# Patient Record
Sex: Male | Born: 1955 | Race: White | Hispanic: No | Marital: Married | Smoking: Former smoker
Health system: Southern US, Community
[De-identification: ages and names within clinical notes are randomized; demographics above are authoritative.]

## PROBLEM LIST (undated history)

## (undated) DIAGNOSIS — F32A Depression, unspecified: Secondary | ICD-10-CM

## (undated) DIAGNOSIS — L570 Actinic keratosis: Secondary | ICD-10-CM

## (undated) DIAGNOSIS — I10 Essential (primary) hypertension: Secondary | ICD-10-CM

## (undated) DIAGNOSIS — E782 Mixed hyperlipidemia: Secondary | ICD-10-CM

## (undated) DIAGNOSIS — F411 Generalized anxiety disorder: Secondary | ICD-10-CM

## (undated) DIAGNOSIS — L309 Dermatitis, unspecified: Secondary | ICD-10-CM

## (undated) HISTORY — DX: Dermatitis, unspecified: L30.9

## (undated) HISTORY — DX: Depression, unspecified: F32.A

## (undated) HISTORY — DX: Actinic keratosis: L57.0

## (undated) HISTORY — DX: Essential (primary) hypertension: I10

## (undated) HISTORY — DX: Generalized anxiety disorder: F41.1

## (undated) HISTORY — DX: Mixed hyperlipidemia: E78.2

---

## 2001-06-19 ENCOUNTER — Encounter: Payer: Self-pay | Admitting: Internal Medicine

## 2001-06-19 ENCOUNTER — Emergency Department (HOSPITAL_COMMUNITY): Admission: EM | Admit: 2001-06-19 | Discharge: 2001-06-19 | Payer: Self-pay | Admitting: Internal Medicine

## 2009-07-26 ENCOUNTER — Encounter: Payer: Self-pay | Admitting: Internal Medicine

## 2009-08-06 ENCOUNTER — Encounter: Payer: Self-pay | Admitting: Internal Medicine

## 2010-02-08 NOTE — Letter (Signed)
Summary: [facility] Patient Note   [facility] Patient Note   Imported By: Roderic Ovens 09/03/2009 12:11:50  _____________________________________________________________________  External Attachment:    Type:   Image     Comment:   External Document

## 2012-09-21 ENCOUNTER — Inpatient Hospital Stay (HOSPITAL_COMMUNITY)
Admission: EM | Admit: 2012-09-21 | Discharge: 2012-09-24 | DRG: 560 | Disposition: A | Discharge status: Home / Self Care | Payer: BC Managed Care – PPO | Attending: General Surgery | Admitting: General Surgery

## 2012-09-21 ENCOUNTER — Emergency Department (HOSPITAL_COMMUNITY): Payer: BC Managed Care – PPO

## 2012-09-21 ENCOUNTER — Encounter (HOSPITAL_COMMUNITY): Payer: Self-pay | Admitting: Emergency Medicine

## 2012-09-21 DIAGNOSIS — S27329A Contusion of lung, unspecified, initial encounter: Secondary | ICD-10-CM | POA: Diagnosis present

## 2012-09-21 DIAGNOSIS — S0003XA Contusion of scalp, initial encounter: Secondary | ICD-10-CM | POA: Diagnosis present

## 2012-09-21 DIAGNOSIS — R6510 Systemic inflammatory response syndrome (SIRS) of non-infectious origin without acute organ dysfunction: Secondary | ICD-10-CM

## 2012-09-21 DIAGNOSIS — S2242XA Multiple fractures of ribs, left side, initial encounter for closed fracture: Secondary | ICD-10-CM

## 2012-09-21 DIAGNOSIS — F1010 Alcohol abuse, uncomplicated: Secondary | ICD-10-CM | POA: Diagnosis present

## 2012-09-21 DIAGNOSIS — S42199A Fracture of other part of scapula, unspecified shoulder, initial encounter for closed fracture: Principal | ICD-10-CM | POA: Diagnosis present

## 2012-09-21 DIAGNOSIS — T148XXA Other injury of unspecified body region, initial encounter: Secondary | ICD-10-CM

## 2012-09-21 DIAGNOSIS — S27322A Contusion of lung, bilateral, initial encounter: Secondary | ICD-10-CM

## 2012-09-21 DIAGNOSIS — S42102A Fracture of unspecified part of scapula, left shoulder, initial encounter for closed fracture: Secondary | ICD-10-CM

## 2012-09-21 DIAGNOSIS — S270XXA Traumatic pneumothorax, initial encounter: Secondary | ICD-10-CM

## 2012-09-21 DIAGNOSIS — S42101A Fracture of unspecified part of scapula, right shoulder, initial encounter for closed fracture: Secondary | ICD-10-CM

## 2012-09-21 DIAGNOSIS — F10929 Alcohol use, unspecified with intoxication, unspecified: Secondary | ICD-10-CM | POA: Diagnosis present

## 2012-09-21 DIAGNOSIS — S2249XA Multiple fractures of ribs, unspecified side, initial encounter for closed fracture: Secondary | ICD-10-CM | POA: Diagnosis present

## 2012-09-21 DIAGNOSIS — S0510XA Contusion of eyeball and orbital tissues, unspecified eye, initial encounter: Secondary | ICD-10-CM | POA: Diagnosis present

## 2012-09-21 DIAGNOSIS — G4700 Insomnia, unspecified: Secondary | ICD-10-CM | POA: Diagnosis present

## 2012-09-21 DIAGNOSIS — T17900A Unspecified foreign body in respiratory tract, part unspecified causing asphyxiation, initial encounter: Secondary | ICD-10-CM

## 2012-09-21 DIAGNOSIS — T07XXXA Unspecified multiple injuries, initial encounter: Secondary | ICD-10-CM | POA: Diagnosis present

## 2012-09-21 DIAGNOSIS — J939 Pneumothorax, unspecified: Secondary | ICD-10-CM

## 2012-09-21 DIAGNOSIS — S2232XA Fracture of one rib, left side, initial encounter for closed fracture: Secondary | ICD-10-CM

## 2012-09-21 DIAGNOSIS — J690 Pneumonitis due to inhalation of food and vomit: Secondary | ICD-10-CM

## 2012-09-21 DIAGNOSIS — S42109A Fracture of unspecified part of scapula, unspecified shoulder, initial encounter for closed fracture: Secondary | ICD-10-CM

## 2012-09-21 DIAGNOSIS — D62 Acute posthemorrhagic anemia: Secondary | ICD-10-CM | POA: Diagnosis not present

## 2012-09-21 LAB — CBC WITH DIFFERENTIAL/PLATELET
HCT: 47.1 % (ref 39.0–52.0)
Hemoglobin: 16.3 g/dL (ref 13.0–17.0)
Lymphocytes Relative: 7 % — ABNORMAL LOW (ref 12–46)
MCHC: 34.6 g/dL (ref 30.0–36.0)
MCV: 90.9 fL (ref 78.0–100.0)
Monocytes Absolute: 0.5 10*3/uL (ref 0.1–1.0)
Monocytes Relative: 5 % (ref 3–12)
Neutro Abs: 8.7 10*3/uL — ABNORMAL HIGH (ref 1.7–7.7)

## 2012-09-21 LAB — COMPREHENSIVE METABOLIC PANEL
BUN: 14 mg/dL (ref 6–23)
CO2: 23 mEq/L (ref 19–32)
Chloride: 105 mEq/L (ref 96–112)
Creatinine, Ser: 1.08 mg/dL (ref 0.50–1.35)
GFR calc Af Amer: 87 mL/min — ABNORMAL LOW (ref >90)
GFR calc non Af Amer: 75 mL/min — ABNORMAL LOW (ref >90)
Total Bilirubin: 0.3 mg/dL (ref 0.3–1.2)

## 2012-09-21 LAB — URINALYSIS, ROUTINE W REFLEX MICROSCOPIC
Bilirubin Urine: NEGATIVE
Glucose, UA: NEGATIVE mg/dL
Ketones, ur: NEGATIVE mg/dL
Leukocytes, UA: NEGATIVE
pH: 5 (ref 5.0–8.0)

## 2012-09-21 LAB — POCT I-STAT, CHEM 8
Creatinine, Ser: 1.6 mg/dL — ABNORMAL HIGH (ref 0.50–1.35)
Hemoglobin: 17.3 g/dL — ABNORMAL HIGH (ref 13.0–17.0)
Sodium: 144 mEq/L (ref 135–145)
TCO2: 22 mmol/L (ref 0–100)

## 2012-09-21 LAB — RAPID URINE DRUG SCREEN, HOSP PERFORMED
Amphetamines: NOT DETECTED
Barbiturates: NOT DETECTED
Benzodiazepines: NOT DETECTED

## 2012-09-21 LAB — ETHANOL: Alcohol, Ethyl (B): 221 mg/dL — ABNORMAL HIGH (ref 0–11)

## 2012-09-21 LAB — LIPASE, BLOOD: Lipase: 167 U/L — ABNORMAL HIGH (ref 11–59)

## 2012-09-21 LAB — GLUCOSE, CAPILLARY: Glucose-Capillary: 140 mg/dL — ABNORMAL HIGH (ref 70–99)

## 2012-09-21 LAB — URINE MICROSCOPIC-ADD ON

## 2012-09-21 LAB — CBC
MCHC: 34.7 g/dL (ref 30.0–36.0)
RDW: 13.2 % (ref 11.5–15.5)

## 2012-09-21 LAB — CREATININE, SERUM
Creatinine, Ser: 0.9 mg/dL (ref 0.50–1.35)
GFR calc non Af Amer: >90 mL/min (ref >90)

## 2012-09-21 LAB — POCT I-STAT TROPONIN I

## 2012-09-21 LAB — MRSA PCR SCREENING: MRSA by PCR: NEGATIVE

## 2012-09-21 LAB — LACTIC ACID, PLASMA: Lactic Acid, Venous: 2.8 mmol/L — ABNORMAL HIGH (ref 0.5–2.2)

## 2012-09-21 MED ORDER — MORPHINE SULFATE 4 MG/ML IJ SOLN
4.0000 mg | Freq: Once | INTRAMUSCULAR | Status: AC
  Administered 2012-09-21: 4 mg via INTRAVENOUS

## 2012-09-21 MED ORDER — ENOXAPARIN SODIUM 40 MG/0.4ML ~~LOC~~ SOLN
40.0000 mg | SUBCUTANEOUS | Status: DC
  Administered 2012-09-21 – 2012-09-23 (×2): 40 mg via SUBCUTANEOUS
  Filled 2012-09-21 (×4): qty 0.4

## 2012-09-21 MED ORDER — MORPHINE SULFATE 2 MG/ML IJ SOLN: 1.0000 mg | INTRAMUSCULAR | Status: DC | PRN

## 2012-09-21 MED ORDER — IOHEXOL 300 MG/ML  SOLN
100.0000 mL | Freq: Once | INTRAMUSCULAR | Status: AC | PRN
  Administered 2012-09-21: 100 mL via INTRAVENOUS

## 2012-09-21 MED ORDER — POTASSIUM CHLORIDE IN NACL 20-0.9 MEQ/L-% IV SOLN
INTRAVENOUS | Status: DC
  Administered 2012-09-21 – 2012-09-23 (×4): via INTRAVENOUS
  Filled 2012-09-21 (×5): qty 1000

## 2012-09-21 MED ORDER — MORPHINE SULFATE 4 MG/ML IJ SOLN
4.0000 mg | INTRAMUSCULAR | Status: DC | PRN
  Administered 2012-09-21 – 2012-09-23 (×8): 4 mg via INTRAVENOUS
  Filled 2012-09-21 (×8): qty 1

## 2012-09-21 MED ORDER — ONDANSETRON HCL 4 MG PO TABS: 4.0000 mg | ORAL_TABLET | Freq: Four times a day (QID) | ORAL | Status: DC | PRN

## 2012-09-21 MED ORDER — SODIUM CHLORIDE 0.9 % IV BOLUS (SEPSIS)
1000.0000 mL | Freq: Once | INTRAVENOUS | Status: AC
  Administered 2012-09-21: 1000 mL via INTRAVENOUS

## 2012-09-21 MED ORDER — PIPERACILLIN-TAZOBACTAM 3.375 G IVPB
3.3750 g | Freq: Three times a day (TID) | INTRAVENOUS | Status: DC
  Administered 2012-09-21 – 2012-09-23 (×6): 3.375 g via INTRAVENOUS
  Filled 2012-09-21 (×7): qty 50

## 2012-09-21 MED ORDER — PANTOPRAZOLE SODIUM 40 MG PO TBEC
40.0000 mg | DELAYED_RELEASE_TABLET | Freq: Every day | ORAL | Status: DC
  Administered 2012-09-21: 40 mg via ORAL
  Filled 2012-09-21: qty 1

## 2012-09-21 MED ORDER — MORPHINE SULFATE 2 MG/ML IJ SOLN
2.0000 mg | INTRAMUSCULAR | Status: DC | PRN
  Administered 2012-09-21 – 2012-09-22 (×2): 2 mg via INTRAVENOUS
  Filled 2012-09-21 (×2): qty 1

## 2012-09-21 MED ORDER — ONDANSETRON HCL 4 MG/2ML IJ SOLN: 4.0000 mg | Freq: Four times a day (QID) | INTRAMUSCULAR | Status: DC | PRN

## 2012-09-21 MED ORDER — TETANUS-DIPHTH-ACELL PERTUSSIS 5-2.5-18.5 LF-MCG/0.5 IM SUSP
INTRAMUSCULAR | Status: AC
  Filled 2012-09-21: qty 0.5

## 2012-09-21 MED ORDER — ONDANSETRON HCL 4 MG/2ML IJ SOLN
4.0000 mg | Freq: Once | INTRAMUSCULAR | Status: AC
  Administered 2012-09-21: 4 mg via INTRAVENOUS

## 2012-09-21 MED ORDER — BACITRACIN ZINC 500 UNIT/GM EX OINT
TOPICAL_OINTMENT | Freq: Two times a day (BID) | CUTANEOUS | Status: DC
  Administered 2012-09-21 (×2): via TOPICAL
  Administered 2012-09-22: 15.5556 via TOPICAL
  Administered 2012-09-22: 10:00:00 via TOPICAL
  Administered 2012-09-23 (×2): 15.5556 via TOPICAL
  Filled 2012-09-21: qty 15
  Filled 2012-09-21 (×2): qty 28.35
  Filled 2012-09-21 (×2): qty 15

## 2012-09-21 MED ORDER — THIAMINE HCL 100 MG/ML IJ SOLN
100.0000 mg | Freq: Once | INTRAMUSCULAR | Status: AC
  Administered 2012-09-21: 100 mg via INTRAVENOUS
  Filled 2012-09-21: qty 2

## 2012-09-21 MED ORDER — PANTOPRAZOLE SODIUM 40 MG IV SOLR
40.0000 mg | Freq: Every day | INTRAVENOUS | Status: DC
  Administered 2012-09-22: 40 mg via INTRAVENOUS
  Filled 2012-09-21 (×2): qty 40

## 2012-09-21 MED ORDER — CLINDAMYCIN PHOSPHATE 600 MG/50ML IV SOLN
600.0000 mg | Freq: Once | INTRAVENOUS | Status: AC
  Administered 2012-09-21: 600 mg via INTRAVENOUS
  Filled 2012-09-21: qty 50

## 2012-09-21 MED ORDER — TETANUS-DIPHTH-ACELL PERTUSSIS 5-2.5-18.5 LF-MCG/0.5 IM SUSP
0.5000 mL | Freq: Once | INTRAMUSCULAR | Status: AC
  Administered 2012-09-21: 0.5 mL via INTRAMUSCULAR
  Filled 2012-09-21: qty 0.5
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
## 2012-09-21 NOTE — ED Notes (Signed)
Pt resting quietly at the time. Vital signs stable. Family at bedside. No signs of acute distress noted.

## 2012-09-21 NOTE — ED Notes (Signed)
DR. Lavella Lemons UPDATED SPOUSE ON RESULTS OF TEST , PLAN OF CARE /ADMISSION .

## 2012-09-21 NOTE — ED Notes (Signed)
Patient transported to CT WITH RN .
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
## 2012-09-21 NOTE — ED Notes (Signed)
Attempted to call report x1. Nurse to call back. 
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
## 2012-09-22 ENCOUNTER — Inpatient Hospital Stay (HOSPITAL_COMMUNITY): Payer: BC Managed Care – PPO

## 2012-09-22 LAB — BASIC METABOLIC PANEL
Calcium: 8.3 mg/dL — ABNORMAL LOW (ref 8.4–10.5)
GFR calc non Af Amer: >90 mL/min (ref >90)
Glucose, Bld: 154 mg/dL — ABNORMAL HIGH (ref 70–99)
Sodium: 139 mEq/L (ref 135–145)

## 2012-09-22 LAB — CBC
Hemoglobin: 11.9 g/dL — ABNORMAL LOW (ref 13.0–17.0)
MCH: 31.6 pg (ref 26.0–34.0)
MCHC: 34.5 g/dL (ref 30.0–36.0)
Platelets: 168 10*3/uL (ref 150–400)

## 2012-09-22 MED ORDER — OXYCODONE-ACETAMINOPHEN 5-325 MG PO TABS
1.0000 | ORAL_TABLET | ORAL | Status: DC | PRN
  Administered 2012-09-22 – 2012-09-23 (×2): 1 via ORAL
  Filled 2012-09-22 (×2): qty 1

## 2012-09-22 MED ORDER — KETOROLAC TROMETHAMINE 30 MG/ML IJ SOLN
30.0000 mg | Freq: Four times a day (QID) | INTRAMUSCULAR | Status: DC
  Administered 2012-09-22 – 2012-09-23 (×4): 30 mg via INTRAVENOUS
  Filled 2012-09-22 (×12): qty 1
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
## 2012-09-22 NOTE — Progress Notes (Signed)
Report given to [facility] RN at this time.  Pt has no c/o pain or s/s of any acute distress at this time.

## 2012-09-23 DIAGNOSIS — S27329A Contusion of lung, unspecified, initial encounter: Secondary | ICD-10-CM

## 2012-09-23 DIAGNOSIS — F10929 Alcohol use, unspecified with intoxication, unspecified: Secondary | ICD-10-CM | POA: Diagnosis present

## 2012-09-23 DIAGNOSIS — D62 Acute posthemorrhagic anemia: Secondary | ICD-10-CM

## 2012-09-23 DIAGNOSIS — S270XXA Traumatic pneumothorax, initial encounter: Secondary | ICD-10-CM

## 2012-09-23 DIAGNOSIS — T07XXXA Unspecified multiple injuries, initial encounter: Secondary | ICD-10-CM | POA: Diagnosis present

## 2012-09-23 DIAGNOSIS — G4700 Insomnia, unspecified: Secondary | ICD-10-CM | POA: Insufficient documentation

## 2012-09-23 DIAGNOSIS — S42102A Fracture of unspecified part of scapula, left shoulder, initial encounter for closed fracture: Secondary | ICD-10-CM

## 2012-09-23 DIAGNOSIS — S2242XA Multiple fractures of ribs, left side, initial encounter for closed fracture: Secondary | ICD-10-CM

## 2012-09-23 MED ORDER — OXYCODONE HCL 5 MG PO TABS
10.0000 mg | ORAL_TABLET | ORAL | Status: DC | PRN
  Administered 2012-09-23 – 2012-09-24 (×4): 10 mg via ORAL
  Filled 2012-09-23 (×4): qty 2

## 2012-09-23 MED ORDER — TRAZODONE HCL 50 MG PO TABS
50.0000 mg | ORAL_TABLET | Freq: Every day | ORAL | Status: DC
  Filled 2012-09-23 (×2): qty 1

## 2012-09-23 MED ORDER — ENOXAPARIN SODIUM 30 MG/0.3ML ~~LOC~~ SOLN
30.0000 mg | Freq: Two times a day (BID) | SUBCUTANEOUS | Status: DC
  Administered 2012-09-23: 30 mg via SUBCUTANEOUS
  Filled 2012-09-23 (×3): qty 0.3

## 2012-09-23 MED ORDER — NAPROXEN 500 MG PO TABS
500.0000 mg | ORAL_TABLET | Freq: Two times a day (BID) | ORAL | Status: DC
  Administered 2012-09-23 – 2012-09-24 (×3): 500 mg via ORAL
  Filled 2012-09-23 (×5): qty 1

## 2012-09-23 MED ORDER — MORPHINE SULFATE 2 MG/ML IJ SOLN: 2.0000 mg | INTRAMUSCULAR | Status: DC | PRN
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
## 2012-09-23 NOTE — Progress Notes (Signed)
Claims he is doing well with IS but I did not see one in his room. Will get a new one. Adjusting pain meds and advancing diet. Patient examined and I agree with the assessment and plan  Violeta Gelinas, MD, MPH, FACS Pager: [phone]  09/23/2012 9:53 AM
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
## 2012-09-23 NOTE — Progress Notes (Signed)
Orthopedic Tech Progress Note Patient Details:  William Goodwin 06-28-1955 [id number]  Ortho Devices Type of Ortho Device: Sling immobilizer Ortho Device/Splint Interventions: Application   Shawnie Pons 09/23/2012, 4:24 PM

## 2012-09-23 NOTE — Progress Notes (Signed)
UR completed 

## 2012-09-24 ENCOUNTER — Inpatient Hospital Stay (HOSPITAL_COMMUNITY): Payer: BC Managed Care – PPO

## 2012-09-24 ENCOUNTER — Encounter (INDEPENDENT_AMBULATORY_CARE_PROVIDER_SITE_OTHER): Payer: Self-pay | Admitting: Orthopedic Surgery

## 2012-09-24 MED ORDER — OXYCODONE-ACETAMINOPHEN 5-325 MG PO TABS: 1.0000 | ORAL_TABLET | ORAL | Status: DC | PRN

## 2012-09-24 MED ORDER — NAPROXEN 500 MG PO TABS: 500.0000 mg | ORAL_TABLET | Freq: Two times a day (BID) | ORAL | Status: DC
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
## 2012-09-24 NOTE — Progress Notes (Signed)
D/C home Violeta Gelinas, MD, MPH, FACS Pager: [phone]
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
## 2012-09-24 NOTE — Discharge Summary (Signed)
Breelle Hollywood, MD, MPH, FACS Pager: [phone]  

## 2012-09-27 LAB — CULTURE, BLOOD (ROUTINE X 2): Culture: NO GROWTH
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
## 2014-02-12 IMAGING — CR DG CHEST 1V PORT
1 series · 1 of 1 positions shown · non-contrast
Comparison: Prior chest CT 09/21/2012

CLINICAL DATA: Follow up pneumothorax

PORTABLE CHEST - 1 VIEW

[AP]
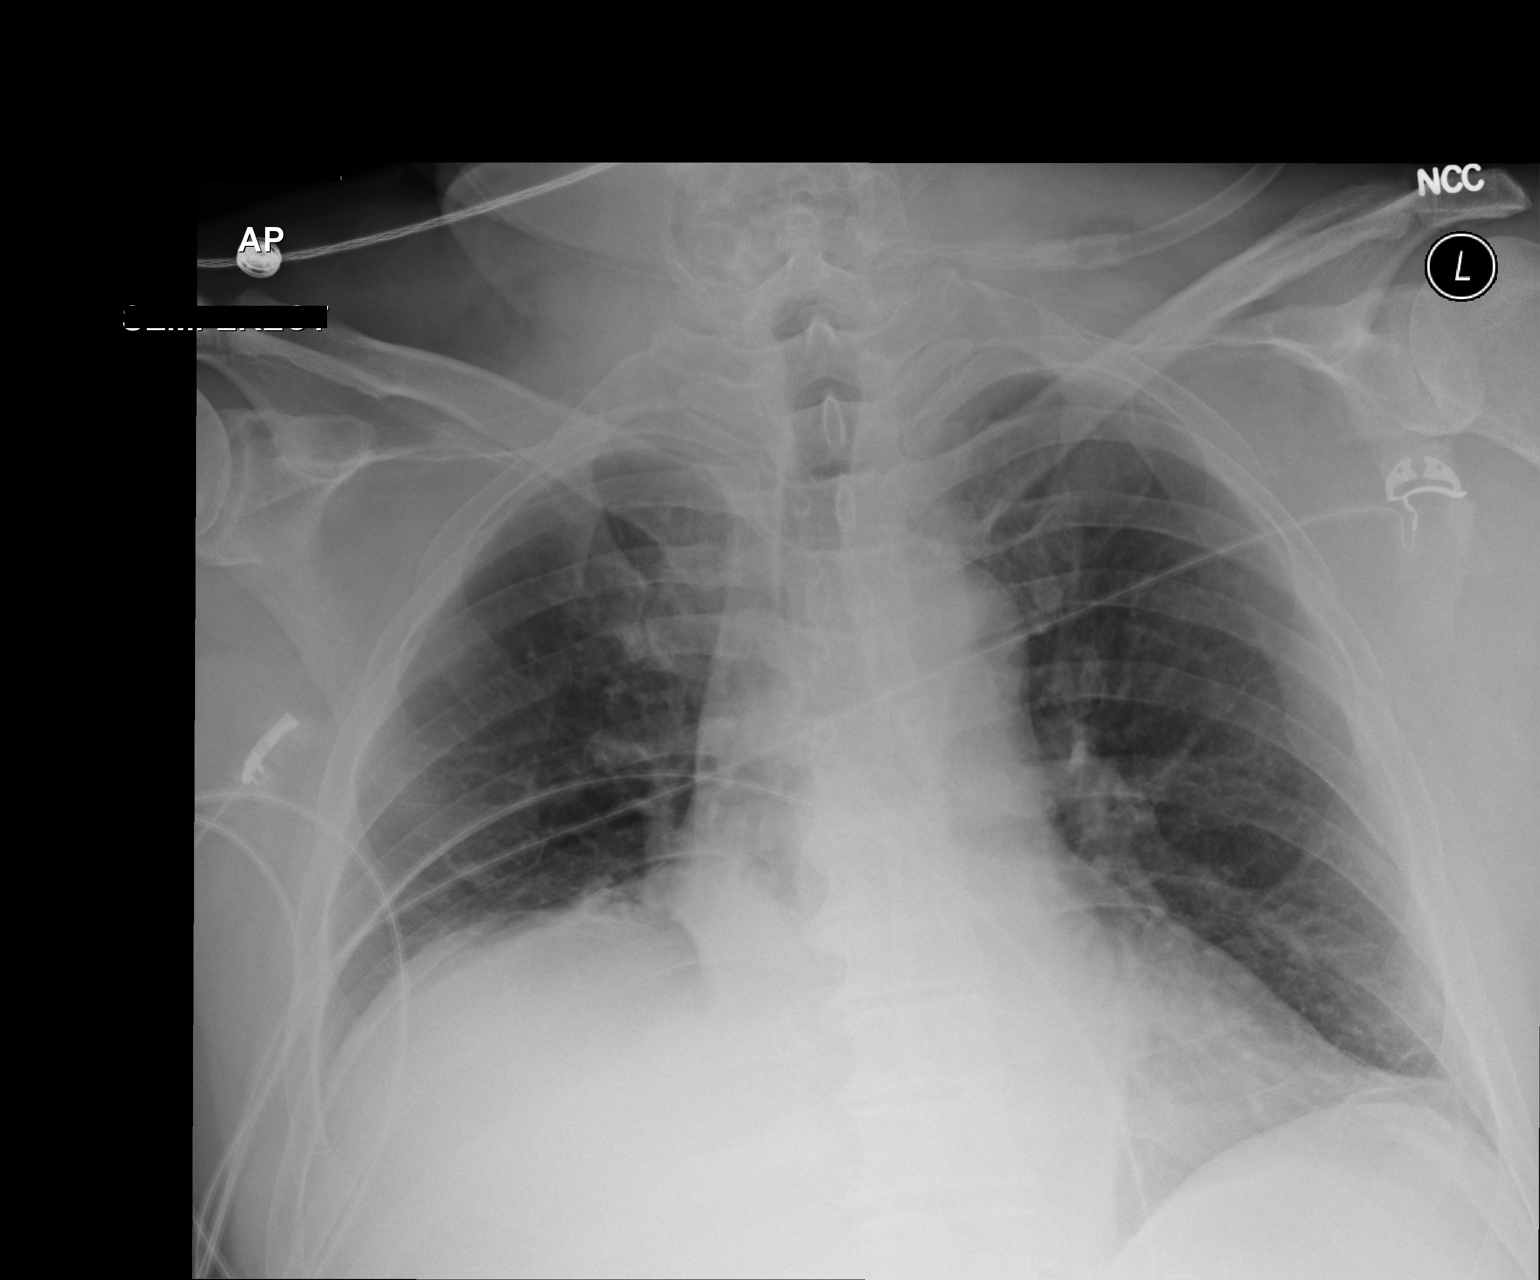

[1 of 1 positions shown; findings below may reference images not displayed]

FINDINGS: No definite pneumothorax identified.  Multiple left-sided
rib fractures are noted.  There is elevation of the right
hemidiaphragm.  Bibasilar subsegmental atelectasis.  Cardiac and
mediastinal contours remain within normal limits.  No pulmonary
edema, pleural effusion or new focal airspace consolidation.
IMPRESSION: 1.  No pneumothorax identified.
2.  Bibasilar subsegmental atelectasis greater on the right than
the left with some elevation of the right hemidiaphragm.
3.  Multiple left-sided rib fractures.

## 2017-01-17 ENCOUNTER — Encounter: Payer: Self-pay | Admitting: Family Medicine

## 2018-11-19 ENCOUNTER — Other Ambulatory Visit: Payer: Self-pay

## 2018-11-19 DIAGNOSIS — Z20822 Contact with and (suspected) exposure to covid-19: Secondary | ICD-10-CM

## 2018-11-20 LAB — NOVEL CORONAVIRUS, NAA: SARS-CoV-2, NAA: NOT DETECTED

## 2019-03-26 ENCOUNTER — Ambulatory Visit: Payer: Self-pay

## 2019-03-28 ENCOUNTER — Ambulatory Visit: Payer: Medicaid Other | Attending: Internal Medicine

## 2019-03-28 ENCOUNTER — Other Ambulatory Visit: Payer: Self-pay

## 2019-03-28 DIAGNOSIS — Z23 Encounter for immunization: Secondary | ICD-10-CM

## 2019-03-28 NOTE — Progress Notes (Signed)
   Covid-19 Vaccination Clinic  Name:  William Goodwin    MRN: [id number] DOB: [birth year]  03/28/2019  [name] was observed post Covid-19 immunization for 15 minutes without incident. He was provided with Vaccine Information Sheet and instruction to access the V-Safe system.   [name] was instructed to call 911 with any severe reactions post vaccine: Marland Kitchen Difficulty breathing  . Swelling of face and throat  . A fast heartbeat  . A bad rash all over body  . Dizziness and weakness   Immunizations Administered    Name Date Dose VIS Date Route   Pfizer COVID-19 Vaccine 03/28/2019  3:14 PM 0.3 mL 12/20/2018 Intramuscular   Manufacturer: [place]   Lot: [id number]   [place]: [id number]

## 2019-04-22 ENCOUNTER — Ambulatory Visit: Payer: Medicaid Other | Attending: Internal Medicine

## 2019-04-22 DIAGNOSIS — Z23 Encounter for immunization: Secondary | ICD-10-CM

## 2019-04-22 DIAGNOSIS — I10 Essential (primary) hypertension: Secondary | ICD-10-CM | POA: Diagnosis not present

## 2019-04-22 DIAGNOSIS — E782 Mixed hyperlipidemia: Secondary | ICD-10-CM | POA: Diagnosis not present

## 2019-04-22 DIAGNOSIS — G4700 Insomnia, unspecified: Secondary | ICD-10-CM | POA: Diagnosis not present

## 2019-04-22 DIAGNOSIS — R635 Abnormal weight gain: Secondary | ICD-10-CM | POA: Diagnosis not present

## 2019-04-22 NOTE — Progress Notes (Signed)
   Covid-19 Vaccination Clinic  Name:  William Goodwin    MRN: [id number] DOB: 1955/04/26  04/22/2019  [name] was observed post Covid-19 immunization for 15 minutes without incident. He was provided with Vaccine Information Sheet and instruction to access the V-Safe system.   [name] was instructed to call 911 with any severe reactions post vaccine: Marland Kitchen Difficulty breathing  . Swelling of face and throat  . A fast heartbeat  . A bad rash all over body  . Dizziness and weakness   Immunizations Administered    Name Date Dose VIS Date Route   Pfizer COVID-19 Vaccine 04/22/2019  4:19 PM 0.3 mL 12/20/2018 Intramuscular   Manufacturer: [place]   Lot: [id number]   [place]: [id number]

## 2019-07-17 DIAGNOSIS — Z0000 Encounter for general adult medical examination without abnormal findings: Secondary | ICD-10-CM | POA: Diagnosis not present

## 2019-07-17 DIAGNOSIS — Z125 Encounter for screening for malignant neoplasm of prostate: Secondary | ICD-10-CM | POA: Diagnosis not present

## 2019-07-17 DIAGNOSIS — Z0184 Encounter for antibody response examination: Secondary | ICD-10-CM | POA: Diagnosis not present

## 2019-07-22 DIAGNOSIS — Z23 Encounter for immunization: Secondary | ICD-10-CM | POA: Diagnosis not present

## 2019-07-22 DIAGNOSIS — Z0000 Encounter for general adult medical examination without abnormal findings: Secondary | ICD-10-CM | POA: Diagnosis not present

## 2019-07-22 DIAGNOSIS — Z1211 Encounter for screening for malignant neoplasm of colon: Secondary | ICD-10-CM | POA: Diagnosis not present

## 2019-07-22 DIAGNOSIS — I10 Essential (primary) hypertension: Secondary | ICD-10-CM | POA: Diagnosis not present

## 2019-07-30 ENCOUNTER — Other Ambulatory Visit: Payer: Self-pay | Admitting: Family Medicine

## 2019-07-30 DIAGNOSIS — Z122 Encounter for screening for malignant neoplasm of respiratory organs: Secondary | ICD-10-CM

## 2019-07-31 ENCOUNTER — Encounter: Payer: Self-pay | Admitting: Gastroenterology

## 2019-08-11 ENCOUNTER — Ambulatory Visit: Payer: Medicaid Other

## 2019-08-25 DIAGNOSIS — I10 Essential (primary) hypertension: Secondary | ICD-10-CM | POA: Diagnosis not present

## 2019-08-25 DIAGNOSIS — Z87891 Personal history of nicotine dependence: Secondary | ICD-10-CM | POA: Diagnosis not present

## 2019-08-25 DIAGNOSIS — R195 Other fecal abnormalities: Secondary | ICD-10-CM | POA: Diagnosis not present

## 2019-09-29 ENCOUNTER — Other Ambulatory Visit: Payer: Self-pay

## 2019-09-29 NOTE — Progress Notes (Signed)
Reconciled meds from outside source

## 2019-09-30 ENCOUNTER — Ambulatory Visit: Payer: Medicaid Other | Admitting: Gastroenterology

## 2019-10-07 ENCOUNTER — Encounter: Payer: Self-pay | Admitting: Gastroenterology

## 2019-10-07 ENCOUNTER — Ambulatory Visit: Payer: BC Managed Care – PPO | Admitting: Gastroenterology

## 2019-10-07 VITALS — BP 120/70 | HR 79 | Ht 69.0 in | Wt 211.0 lb

## 2019-10-07 DIAGNOSIS — Z1211 Encounter for screening for malignant neoplasm of colon: Secondary | ICD-10-CM

## 2019-10-07 DIAGNOSIS — R195 Other fecal abnormalities: Secondary | ICD-10-CM | POA: Diagnosis not present

## 2019-10-07 DIAGNOSIS — K6289 Other specified diseases of anus and rectum: Secondary | ICD-10-CM | POA: Diagnosis not present

## 2019-10-07 MED ORDER — SUTAB 1479-225-188 MG PO TABS: 1.0000 | ORAL_TABLET | Freq: Once | ORAL | 0 refills | Status: AC

## 2019-10-07 NOTE — Patient Instructions (Addendum)
If you are age 63 or older, your body mass index should be between 23-30. Your Body mass index is 31.16 kg/m. If this is out of the aforementioned range listed, please consider follow up with your Primary Care Provider.  If you are age 25 or younger, your body mass index should be between 19-25. Your Body mass index is 31.16 kg/m. If this is out of the aformentioned range listed, please consider follow up with your Primary Care Provider.   You have been scheduled for a colonoscopy. Please follow written instructions given to you at your visit today.  Please pick up your prep supplies at the pharmacy within the next 1-3 days. If you use inhalers (even only as needed), please bring them with you on the day of your procedure.  Thank you for entrusting me with your care and for choosing [facility], Dr. Erath Cellar

## 2019-10-07 NOTE — Progress Notes (Signed)
HPI :  64 year old male with a history of HTN, HLD, insomnia, referred by Rachell Cipro MD for heme positive stools.   The patient has never had a prior colonoscopy.  He denies any family history of colon cancer.  He has normal bowel function.  States he really does not have any blood noted in the stools.  He does have some occasional blood noted on the toilet paper when wiping himself in the setting of having a painful bowel movement.  This is not too common and is a rare occurrence.  He denies any abdominal pains.  He eats well, no reflux or dysphagia.  No weight loss.  He denies any cardiopulmonary symptoms.  He has no prior surgical history.  He smoked for about 20 years, but quit about 24 years ago. He denies any history of anemia.    Ifob 07/22/19 - positive  Labs from PCP: 07/17/19 - Hgb 15.0, MCV 94, WBC 5.9, plt 212 BUN 15, Cr 1.08 AP 77, AST 21, ALT 34   Past Medical History:  Diagnosis Date  . Benign essential hypertension   . Depression   . Eczema   . Generalized anxiety disorder   . Mixed hyperlipidemia   . Senile hyperkeratosis      History reviewed. No pertinent surgical history. PSH - none History reviewed. No pertinent family history. Denies any medical history in his family Social History   Tobacco Use  . Smoking status: Former Research scientist (life sciences)  . Smokeless tobacco: Never Used  Substance Use Topics  . Alcohol use: Yes    Comment: 2-4 drinks per week  . Drug use: No   Current Outpatient Medications  Medication Sig Dispense Refill  . losartan (COZAAR) 50 MG tablet Take 50 mg by mouth daily.    . rosuvastatin (CRESTOR) 5 MG tablet Take 5 mg by mouth daily.    . traZODone (DESYREL) 50 MG tablet Take 50 mg by mouth at bedtime.    Marland Kitchen zolpidem (AMBIEN) 10 MG tablet Take 10 mg by mouth at bedtime as needed.     No current facility-administered medications for this visit.   No Known Allergies   Review of Systems: All systems reviewed and negative except where noted  in HPI.    Labs per HPI  Physical Exam: BP 120/70   Pulse 79   Ht 5\' 9"  (1.753 m)   Wt 211 lb (95.7 kg)   BMI 31.16 kg/m  Constitutional: Pleasant,well-developed, male in no acute distress. HEENT: Normocephalic and atraumatic. Conjunctivae are normal. No scleral icterus. Neck supple.  Cardiovascular: Normal rate, regular rhythm.  Pulmonary/chest: Effort normal and breath sounds normal. No wheezing, rales or rhonchi. Abdominal: Soft, nondistended, nontender.  There are no masses palpable.  Extremities: no edema Lymphadenopathy: No cervical adenopathy noted. Neurological: Alert and oriented to person place and time. Skin: Skin is warm and dry. No rashes noted. Psychiatric: Normal mood and affect. Behavior is normal.   ASSESSMENT AND PLAN: 64 year old male here for new patient assessment of the following:  Heme positive stool / colon cancer screening / rectal discomfort - history as above, patient very healthy without anemia presenting with heme positive stool, positive FIT.  No overt blood in the stool but does have rare blood in the toilet paper in the setting of passing a bowel movement that causes rectal discomfort.  He is never had a colonoscopy, he warrants a colonoscopy to evaluate the stool test.  This will also evaluate his rectal symptoms as well.  I  discussed colonoscopy with him, risk benefits of the exam and that of anesthesia and he wants to proceed.  Further recommendations pending results.  He agreed  Fowler Cellar, MD [facility]  CC: Fanny Bien, MD

## 2019-10-23 DIAGNOSIS — E782 Mixed hyperlipidemia: Secondary | ICD-10-CM | POA: Diagnosis not present

## 2019-10-23 DIAGNOSIS — E669 Obesity, unspecified: Secondary | ICD-10-CM | POA: Diagnosis not present

## 2019-10-23 DIAGNOSIS — G4700 Insomnia, unspecified: Secondary | ICD-10-CM | POA: Diagnosis not present

## 2019-10-23 DIAGNOSIS — I10 Essential (primary) hypertension: Secondary | ICD-10-CM | POA: Diagnosis not present

## 2019-11-04 DIAGNOSIS — I10 Essential (primary) hypertension: Secondary | ICD-10-CM | POA: Diagnosis not present

## 2019-11-04 DIAGNOSIS — E669 Obesity, unspecified: Secondary | ICD-10-CM | POA: Diagnosis not present

## 2019-11-04 DIAGNOSIS — Z23 Encounter for immunization: Secondary | ICD-10-CM | POA: Diagnosis not present

## 2019-11-04 DIAGNOSIS — E782 Mixed hyperlipidemia: Secondary | ICD-10-CM | POA: Diagnosis not present

## 2019-11-14 ENCOUNTER — Ambulatory Visit (AMBULATORY_SURGERY_CENTER): Payer: BC Managed Care – PPO | Admitting: Gastroenterology

## 2019-11-14 ENCOUNTER — Encounter: Payer: Self-pay | Admitting: Gastroenterology

## 2019-11-14 ENCOUNTER — Other Ambulatory Visit: Payer: Self-pay

## 2019-11-14 VITALS — BP 139/90 | HR 67 | Temp 98.3°F | Resp 21 | Ht 69.0 in | Wt 211.0 lb

## 2019-11-14 DIAGNOSIS — D120 Benign neoplasm of cecum: Secondary | ICD-10-CM | POA: Diagnosis not present

## 2019-11-14 DIAGNOSIS — D129 Benign neoplasm of anus and anal canal: Secondary | ICD-10-CM

## 2019-11-14 DIAGNOSIS — R195 Other fecal abnormalities: Secondary | ICD-10-CM

## 2019-11-14 DIAGNOSIS — D128 Benign neoplasm of rectum: Secondary | ICD-10-CM

## 2019-11-14 DIAGNOSIS — K621 Rectal polyp: Secondary | ICD-10-CM | POA: Diagnosis not present

## 2019-11-14 DIAGNOSIS — K5730 Diverticulosis of large intestine without perforation or abscess without bleeding: Secondary | ICD-10-CM

## 2019-11-14 MED ORDER — SODIUM CHLORIDE 0.9 % IV SOLN: 500.0000 mL | INTRAVENOUS | Status: DC

## 2019-11-14 NOTE — Patient Instructions (Signed)
Handouts provided on polyps, diverticulosis and hemorrhoids.   YOU HAD AN ENDOSCOPIC PROCEDURE TODAY AT THE [facility]:   Refer to the procedure report that was given to you for any specific questions about what was found during the examination.  If the procedure report does not answer your questions, please call your gastroenterologist to clarify.  If you requested that your care partner not be given the details of your procedure findings, then the procedure report has been included in a sealed envelope for you to review at your convenience later.  YOU SHOULD EXPECT: Some feelings of bloating in the abdomen. Passage of more gas than usual.  Walking can help get rid of the air that was put into your GI tract during the procedure and reduce the bloating. If you had a lower endoscopy (such as a colonoscopy or flexible sigmoidoscopy) you may notice spotting of blood in your stool or on the toilet paper. If you underwent a bowel prep for your procedure, you may not have a normal bowel movement for a few days.  Please Note:  You might notice some irritation and congestion in your nose or some drainage.  This is from the oxygen used during your procedure.  There is no need for concern and it should clear up in a day or so.  SYMPTOMS TO REPORT IMMEDIATELY:   Following lower endoscopy (colonoscopy or flexible sigmoidoscopy):  Excessive amounts of blood in the stool  Significant tenderness or worsening of abdominal pains  Swelling of the abdomen that is new, acute  Fever of 100F or higher   For urgent or emergent issues, a gastroenterologist can be reached at any hour by calling [phone]. Do not use MyChart messaging for urgent concerns.    DIET:  We do recommend a small meal at first, but then you may proceed to your regular diet.  Drink plenty of fluids but you should avoid alcoholic beverages for 24 hours.  ACTIVITY:  You should plan to take it easy for the rest of today and  you should NOT DRIVE or use heavy machinery until tomorrow (because of the sedation medicines used during the test).    FOLLOW UP: Our staff will call the number listed on your records 48-72 hours following your procedure to check on you and address any questions or concerns that you may have regarding the information given to you following your procedure. If we do not reach you, we will leave a message.  We will attempt to reach you two times.  During this call, we will ask if you have developed any symptoms of COVID 19. If you develop any symptoms (ie: fever, flu-like symptoms, shortness of breath, cough etc.) before then, please call [phone].  If you test positive for Covid 19 in the 2 weeks post procedure, please call and report this information to us.    If any biopsies were taken you will be contacted by phone or by letter within the next 1-3 weeks.  Please call us at [phone] if you have not heard about the biopsies in 3 weeks.    SIGNATURES/CONFIDENTIALITY: You and/or your care partner have signed paperwork which will be entered into your electronic medical record.  These signatures attest to the fact that that the information above on your After Visit Summary has been reviewed and is understood.  Full responsibility of the confidentiality of this discharge information lies with you and/or your care-partner.  

## 2019-11-14 NOTE — Op Note (Signed)
[place] Patient Name: William Goodwin Procedure Date: 11/14/2019 1:10 PM MRN: [id number] Endoscopist: Remo Lipps P. Havery Moros , MD Age: 64 Referring MD:  Date of Birth: [birth year] Gender: Male Account #: [id number] Procedure:                Colonoscopy Indications:              Positive fecal immunochemical test Medicines:                Monitored Anesthesia Care Procedure:                Pre-Anesthesia Assessment:                           - Prior to the procedure, a History and Physical                            was performed, and patient medications and                            allergies were reviewed. The patient's tolerance of                            previous anesthesia was also reviewed. The risks                            and benefits of the procedure and the sedation                            options and risks were discussed with the patient.                            All questions were answered, and informed consent                            was obtained. Prior Anticoagulants: The patient has                            taken no previous anticoagulant or antiplatelet                            agents. ASA Grade Assessment: II - A patient with                            mild systemic disease. After reviewing the risks                            and benefits, the patient was deemed in                            satisfactory condition to undergo the procedure.                           After obtaining informed consent, the colonoscope  was passed under direct vision. Throughout the                            procedure, the patient's blood pressure, pulse, and                            oxygen saturations were monitored continuously. The                            Colonoscope was introduced through the anus and                            advanced to the the terminal ileum, with                            identification of the  appendiceal orifice and IC                            valve. The colonoscopy was performed without                            difficulty. The patient tolerated the procedure                            well. The quality of the bowel preparation was                            adequate. The terminal ileum, ileocecal valve,                            appendiceal orifice, and rectum were photographed. Scope In: 1:30:43 PM Scope Out: 1:51:00 PM Scope Withdrawal Time: 0 hours 17 minutes 26 seconds  Total Procedure Duration: 0 hours 20 minutes 17 seconds  Findings:                 The perianal and digital rectal examinations were                            normal.                           The terminal ileum appeared normal.                           A 4 mm polyp was found in the appendiceal orifice.                            The polyp was sessile. The polyp was removed with a                            cold snare. Resection and retrieval were complete.                           A diminutive polyp was found in the rectum. The  polyp was sessile. The polyp was removed with a                            cold snare. Resection and retrieval were complete.                           Scattered medium-mouthed diverticula were found in                            the entire colon.                           Internal hemorrhoids were found during                            retroflexion. The hemorrhoids were small.                           The exam was otherwise without abnormality. Complications:            No immediate complications. Estimated blood loss:                            Minimal. Estimated Blood Loss:     Estimated blood loss was minimal. Impression:               - The examined portion of the ileum was normal.                           - One 4 mm polyp at the appendiceal orifice,                            removed with a cold snare. Resected and retrieved.                            - One diminutive polyp in the rectum, removed with                            a cold snare. Resected and retrieved.                           - Diverticulosis in the entire examined colon.                           - Internal hemorrhoids.                           - The examination was otherwise normal. Recommendation:           - Patient has a contact number available for                            emergencies. The signs and symptoms of potential                            delayed complications were discussed with  the                            patient. Return to normal activities tomorrow.                            Written discharge instructions were provided to the                            patient.                           - Resume previous diet.                           - Continue present medications.                           - Await pathology results. Remo Lipps P. Osmar Howton, MD 11/14/2019 1:55:44 PM This report has been signed electronically.

## 2019-11-14 NOTE — Progress Notes (Signed)
Called to room to assist during endoscopic procedure.  Patient ID and intended procedure confirmed with present staff. Received instructions for my participation in the procedure from the performing physician.  

## 2019-11-14 NOTE — Progress Notes (Signed)
pt tolerated well. VSS. awake and to recovery. Report given to RN.  

## 2019-11-18 ENCOUNTER — Telehealth: Payer: Self-pay

## 2019-11-18 NOTE — Telephone Encounter (Signed)
  Follow up Call-  Call back number 11/14/2019  Post procedure Call Back phone  # [phone]  Permission to leave phone message Yes  Some recent data might be hidden     1st follow up call made.  NALM

## 2019-11-18 NOTE — Telephone Encounter (Signed)
  Follow up Call-  Call back number 11/14/2019  Post procedure Call Back phone  # [phone]  Permission to leave phone message Yes  Some recent data might be hidden     Patient questions:  Do you have a fever, pain , or abdominal swelling? No. Pain Score  0 *  Have you tolerated food without any problems? Yes.    Have you been able to return to your normal activities? Yes.    Do you have any questions about your discharge instructions: Diet   No. Medications  No. Follow up visit  No.  Do you have questions or concerns about your Care? No.  Actions: * If pain score is 4 or above: No action needed, pain <4.  1. Have you developed a fever since your procedure? no  2.   Have you had an respiratory symptoms (SOB or cough) since your procedure? no  3.   Have you tested positive for COVID 19 since your procedure no  4.   Have you had any family members/close contacts diagnosed with the COVID 19 since your procedure?  no   If yes to any of these questions please route to Joylene John, RN and Joella Prince, RN

## 2019-12-08 DIAGNOSIS — E782 Mixed hyperlipidemia: Secondary | ICD-10-CM | POA: Diagnosis not present

## 2019-12-08 DIAGNOSIS — I10 Essential (primary) hypertension: Secondary | ICD-10-CM | POA: Diagnosis not present

## 2019-12-08 DIAGNOSIS — Z23 Encounter for immunization: Secondary | ICD-10-CM | POA: Diagnosis not present

## 2019-12-08 DIAGNOSIS — E669 Obesity, unspecified: Secondary | ICD-10-CM | POA: Diagnosis not present

## 2020-01-29 DIAGNOSIS — I10 Essential (primary) hypertension: Secondary | ICD-10-CM | POA: Diagnosis not present

## 2020-01-29 DIAGNOSIS — E669 Obesity, unspecified: Secondary | ICD-10-CM | POA: Diagnosis not present

## 2020-01-29 DIAGNOSIS — E782 Mixed hyperlipidemia: Secondary | ICD-10-CM | POA: Diagnosis not present

## 2020-01-29 DIAGNOSIS — G4700 Insomnia, unspecified: Secondary | ICD-10-CM | POA: Diagnosis not present

## 2020-04-19 DIAGNOSIS — I10 Essential (primary) hypertension: Secondary | ICD-10-CM | POA: Diagnosis not present

## 2020-04-19 DIAGNOSIS — E669 Obesity, unspecified: Secondary | ICD-10-CM | POA: Diagnosis not present

## 2020-04-19 DIAGNOSIS — G4700 Insomnia, unspecified: Secondary | ICD-10-CM | POA: Diagnosis not present

## 2020-04-19 DIAGNOSIS — E782 Mixed hyperlipidemia: Secondary | ICD-10-CM | POA: Diagnosis not present

## 2020-04-22 DIAGNOSIS — Z23 Encounter for immunization: Secondary | ICD-10-CM | POA: Diagnosis not present

## 2020-06-14 DIAGNOSIS — U071 COVID-19: Secondary | ICD-10-CM | POA: Diagnosis not present

## 2020-06-17 DIAGNOSIS — U071 COVID-19: Secondary | ICD-10-CM | POA: Diagnosis not present

## 2020-06-28 DIAGNOSIS — Z20822 Contact with and (suspected) exposure to covid-19: Secondary | ICD-10-CM | POA: Diagnosis not present

## 2020-06-28 DIAGNOSIS — U071 COVID-19: Secondary | ICD-10-CM | POA: Diagnosis not present

## 2020-07-20 DIAGNOSIS — Z0000 Encounter for general adult medical examination without abnormal findings: Secondary | ICD-10-CM | POA: Diagnosis not present

## 2020-07-20 DIAGNOSIS — Z125 Encounter for screening for malignant neoplasm of prostate: Secondary | ICD-10-CM | POA: Diagnosis not present

## 2020-07-20 DIAGNOSIS — E782 Mixed hyperlipidemia: Secondary | ICD-10-CM | POA: Diagnosis not present

## 2020-07-22 DIAGNOSIS — Z0000 Encounter for general adult medical examination without abnormal findings: Secondary | ICD-10-CM | POA: Diagnosis not present

## 2020-07-22 DIAGNOSIS — Z23 Encounter for immunization: Secondary | ICD-10-CM | POA: Diagnosis not present

## 2020-09-24 DIAGNOSIS — G4700 Insomnia, unspecified: Secondary | ICD-10-CM | POA: Diagnosis not present

## 2020-09-24 DIAGNOSIS — E782 Mixed hyperlipidemia: Secondary | ICD-10-CM | POA: Diagnosis not present

## 2020-09-24 DIAGNOSIS — I10 Essential (primary) hypertension: Secondary | ICD-10-CM | POA: Diagnosis not present

## 2021-01-20 DIAGNOSIS — E782 Mixed hyperlipidemia: Secondary | ICD-10-CM | POA: Diagnosis not present

## 2021-01-20 DIAGNOSIS — Z23 Encounter for immunization: Secondary | ICD-10-CM | POA: Diagnosis not present

## 2021-01-23 DIAGNOSIS — E782 Mixed hyperlipidemia: Secondary | ICD-10-CM | POA: Diagnosis not present

## 2021-01-23 DIAGNOSIS — I10 Essential (primary) hypertension: Secondary | ICD-10-CM | POA: Diagnosis not present

## 2021-01-23 DIAGNOSIS — G4700 Insomnia, unspecified: Secondary | ICD-10-CM | POA: Diagnosis not present

## 2021-08-09 DIAGNOSIS — Z125 Encounter for screening for malignant neoplasm of prostate: Secondary | ICD-10-CM | POA: Diagnosis not present

## 2021-08-09 DIAGNOSIS — Z0000 Encounter for general adult medical examination without abnormal findings: Secondary | ICD-10-CM | POA: Diagnosis not present

## 2021-08-09 DIAGNOSIS — Z114 Encounter for screening for human immunodeficiency virus [HIV]: Secondary | ICD-10-CM | POA: Diagnosis not present

## 2021-08-16 DIAGNOSIS — Z23 Encounter for immunization: Secondary | ICD-10-CM | POA: Diagnosis not present

## 2021-08-16 DIAGNOSIS — Z0000 Encounter for general adult medical examination without abnormal findings: Secondary | ICD-10-CM | POA: Diagnosis not present

## 2021-10-07 DIAGNOSIS — U071 COVID-19: Secondary | ICD-10-CM | POA: Diagnosis not present

## 2021-11-03 DIAGNOSIS — Z23 Encounter for immunization: Secondary | ICD-10-CM | POA: Diagnosis not present
